# Patient Record
Sex: Female | Born: 1980 | Race: White | Hispanic: No | Smoking: Current every day smoker
Health system: Southern US, Community
[De-identification: ages and names within clinical notes are randomized; demographics above are authoritative.]

## PROBLEM LIST (undated history)

## (undated) DIAGNOSIS — F419 Anxiety disorder, unspecified: Secondary | ICD-10-CM

## (undated) DIAGNOSIS — R519 Headache, unspecified: Secondary | ICD-10-CM

## (undated) DIAGNOSIS — N920 Excessive and frequent menstruation with regular cycle: Secondary | ICD-10-CM

## (undated) DIAGNOSIS — R87612 Low grade squamous intraepithelial lesion on cytologic smear of cervix (LGSIL): Secondary | ICD-10-CM

## (undated) DIAGNOSIS — B019 Varicella without complication: Secondary | ICD-10-CM

## (undated) DIAGNOSIS — N946 Dysmenorrhea, unspecified: Secondary | ICD-10-CM

## (undated) DIAGNOSIS — D649 Anemia, unspecified: Secondary | ICD-10-CM

## (undated) DIAGNOSIS — S92309A Fracture of unspecified metatarsal bone(s), unspecified foot, initial encounter for closed fracture: Secondary | ICD-10-CM

## (undated) DIAGNOSIS — M5030 Other cervical disc degeneration, unspecified cervical region: Secondary | ICD-10-CM

## (undated) DIAGNOSIS — I10 Essential (primary) hypertension: Secondary | ICD-10-CM

## (undated) DIAGNOSIS — E785 Hyperlipidemia, unspecified: Secondary | ICD-10-CM

## (undated) HISTORY — PX: WISDOM TOOTH EXTRACTION: SHX21

---

## 2013-02-07 ENCOUNTER — Ambulatory Visit: Payer: Self-pay | Admitting: Orthopedic Surgery

## 2016-04-12 HISTORY — PX: OTHER SURGICAL HISTORY: SHX169

## 2016-05-26 DIAGNOSIS — J019 Acute sinusitis, unspecified: Secondary | ICD-10-CM | POA: Diagnosis not present

## 2016-07-05 DIAGNOSIS — M542 Cervicalgia: Secondary | ICD-10-CM | POA: Diagnosis not present

## 2016-07-05 DIAGNOSIS — E782 Mixed hyperlipidemia: Secondary | ICD-10-CM | POA: Diagnosis not present

## 2016-07-05 DIAGNOSIS — I10 Essential (primary) hypertension: Secondary | ICD-10-CM | POA: Diagnosis not present

## 2016-09-30 DIAGNOSIS — J301 Allergic rhinitis due to pollen: Secondary | ICD-10-CM | POA: Diagnosis not present

## 2016-09-30 DIAGNOSIS — J342 Deviated nasal septum: Secondary | ICD-10-CM | POA: Diagnosis not present

## 2016-10-06 DIAGNOSIS — J301 Allergic rhinitis due to pollen: Secondary | ICD-10-CM | POA: Diagnosis not present

## 2016-10-21 DIAGNOSIS — J342 Deviated nasal septum: Secondary | ICD-10-CM | POA: Diagnosis not present

## 2016-10-21 DIAGNOSIS — J3489 Other specified disorders of nose and nasal sinuses: Secondary | ICD-10-CM | POA: Diagnosis not present

## 2017-03-11 DIAGNOSIS — I10 Essential (primary) hypertension: Secondary | ICD-10-CM | POA: Diagnosis not present

## 2017-03-11 DIAGNOSIS — Z0000 Encounter for general adult medical examination without abnormal findings: Secondary | ICD-10-CM | POA: Diagnosis not present

## 2017-03-15 DIAGNOSIS — J342 Deviated nasal septum: Secondary | ICD-10-CM | POA: Diagnosis not present

## 2017-03-15 DIAGNOSIS — J3489 Other specified disorders of nose and nasal sinuses: Secondary | ICD-10-CM | POA: Diagnosis not present

## 2017-03-15 DIAGNOSIS — J343 Hypertrophy of nasal turbinates: Secondary | ICD-10-CM | POA: Diagnosis not present

## 2017-04-26 DIAGNOSIS — E782 Mixed hyperlipidemia: Secondary | ICD-10-CM | POA: Diagnosis not present

## 2017-04-26 DIAGNOSIS — Z79899 Other long term (current) drug therapy: Secondary | ICD-10-CM | POA: Diagnosis not present

## 2017-04-26 DIAGNOSIS — I10 Essential (primary) hypertension: Secondary | ICD-10-CM | POA: Diagnosis not present

## 2017-07-07 DIAGNOSIS — J3489 Other specified disorders of nose and nasal sinuses: Secondary | ICD-10-CM | POA: Diagnosis not present

## 2017-07-07 DIAGNOSIS — M950 Acquired deformity of nose: Secondary | ICD-10-CM | POA: Diagnosis not present

## 2017-07-07 DIAGNOSIS — J342 Deviated nasal septum: Secondary | ICD-10-CM | POA: Diagnosis not present

## 2017-07-07 DIAGNOSIS — J343 Hypertrophy of nasal turbinates: Secondary | ICD-10-CM | POA: Diagnosis not present

## 2017-09-09 DIAGNOSIS — E782 Mixed hyperlipidemia: Secondary | ICD-10-CM | POA: Diagnosis not present

## 2018-02-15 DIAGNOSIS — G43709 Chronic migraine without aura, not intractable, without status migrainosus: Secondary | ICD-10-CM | POA: Diagnosis not present

## 2021-01-08 ENCOUNTER — Other Ambulatory Visit: Payer: Self-pay | Admitting: Internal Medicine

## 2021-01-08 DIAGNOSIS — Z1231 Encounter for screening mammogram for malignant neoplasm of breast: Secondary | ICD-10-CM

## 2021-01-23 ENCOUNTER — Other Ambulatory Visit: Payer: Self-pay

## 2021-01-23 ENCOUNTER — Ambulatory Visit
Admission: RE | Admit: 2021-01-23 | Discharge: 2021-01-23 | Disposition: A | Discharge status: Home / Self Care | Payer: 59 | Source: Ambulatory Visit | Attending: Internal Medicine | Admitting: Internal Medicine

## 2021-01-23 DIAGNOSIS — Z1231 Encounter for screening mammogram for malignant neoplasm of breast: Secondary | ICD-10-CM | POA: Insufficient documentation

## 2022-07-28 ENCOUNTER — Other Ambulatory Visit: Payer: Self-pay | Admitting: Obstetrics and Gynecology

## 2022-09-21 NOTE — H&P (Signed)
Kristi English is a 42 y.o. female here for tah + bilateral salpingectomy  .   Pt is here for follow up for severe dysmenorrhea and menorrhagia. Pain is so bad she has to take off days from work. Her bleeding is 7 days ++ clots . + tobacco use  . [name] She declines conservative tx . She also has LGSIL on pap and her exam is difficult with her cervix high in vault .   U/s today :  Uterus anteverted   Endometrium=6.65mm   Left ovary appears wnl Rt dominant follicle=1.3cm No free fluid seen   Fibroid seen: fundal posterior=1.8cm       Past Medical History:  has a past medical history of Allergic state, Anxiety, Chickenpox, DDD (degenerative disc disease), cervical, Hyperlipidemia, Hypertension, and Migraine.  Past Surgical History:  has a past surgical history that includes Wisdom teeth. Family History: family history includes Diabetes in her father and maternal grandmother; Hyperlipidemia (Elevated cholesterol) in her mother; Migraines in her mother. Social History:  reports that she has been smoking cigarettes. She has been smoking an average of 0.5 packs per day. She has never used smokeless tobacco. She reports current alcohol use. She reports that she does not use drugs. OB/GYN History:  OB History       Gravida  0   Para  0   Term  0   Preterm  0   AB  0   Living  0        SAB  0   IAB  0   Ectopic  0   Molar      Multiple  0   Live Births                Allergies: is allergic to xyzal [levocetirizine]. Medications:  Current Medications    Current Outpatient Medications:    ALPRAZolam (XANAX) 0.5 MG tablet, Take 1 tablet (0.5 mg total) by mouth 2 (two) times daily as needed for Sleep, Disp: 60 tablet, Rfl: 5   bisoproloL-hydroCHLOROthiazide (ZIAC) 10-6.25 mg tablet, TAKE 1 TABLET DAILY, Disp: 90 tablet, Rfl: 3   Compound Medication, Lipo den 1mL given IM mthly in office, Disp: , Rfl:    cyanocobalamin (VITAMIN B12) 1,000 mcg/mL injection, Inject into the  muscle monthly IM mthly in office, Disp: , Rfl:    diphenhydrAMINE (BENADRYL) 25 mg capsule, Take 25 mg by mouth once daily. Take at bedtime., Disp: , Rfl:    FLUoxetine (PROZAC) 20 MG capsule, TAKE 2 CAPSULES ONCE DAILY, Disp: 180 capsule, Rfl: 3   phentermine (ADIPEX-P) 37.5 mg tablet, Take 1 tablet (37.5 mg total) by mouth every morning before breakfast for 30 days, Disp: 30 tablet, Rfl: 0   rosuvastatin (CRESTOR) 10 MG tablet, TAKE 1 TABLET DAILY (NO MORE WITHOUT APPOINTMENT), Disp: 90 tablet, Rfl: 3   albuterol 90 mcg/actuation inhaler, Inhale 2 inhalations into the lungs every 6 (six) hours as needed for Wheezing (Patient not taking: Reported on 09/04/2021), Disp: 1 each, Rfl: 0   oxyBUTYnin (DITROPAN) 5 mg tablet, Take 1 tablet (5 mg total) by mouth 2 (two) times daily (Patient not taking: Reported on 03/10/2022), Disp: 60 tablet, Rfl: 1   topiramate (TOPAMAX) 50 MG tablet, Take 1 tablet (50 mg total) by mouth at bedtime, Disp: 90 tablet, Rfl: 3     Review of Systems: General:                      No fatigue or  weight loss Eyes:                           No vision changes Ears:                            No hearing difficulty Respiratory:                No cough or shortness of breath Pulmonary:                  No asthma or shortness of breath Cardiovascular:           No chest pain, palpitations, dyspnea on exertion Gastrointestinal:          No abdominal bloating, chronic diarrhea, constipations, masses, pain or hematochezia Genitourinary:             No hematuria, dysuria, abnormal vaginal discharge, pelvic pain, Menometrorrhagia Lymphatic:                   No swollen lymph nodes Musculoskeletal:No muscle weakness Neurologic:                  No extremity weakness, syncope, seizure disorder Psychiatric:                  No history of depression, delusions or suicidal/homicidal ideation      Exam:       Vitals:    06/29/22 1546  BP: 114/84  Pulse: 94      Body mass index  is 32.28 kg/m.   WDWN white/  female in NAD   Lungs: CTA  CV : RRR without murmur   Breast: exam done in sitting and lying position : No dimpling or retraction, no dominant mass, no spontaneous discharge, no axillary adenopathy Neck:  no thyromegaly Abdomen: soft , no mass, normal active bowel sounds,  non-tender, no rebound tenderness Pelvic: tanner stage 5 ,  External genitalia: vulva /labia no lesions Urethra: no prolapse Vagina: normal physiologic d/c Cervix:  +cervix high , large speculum no lesions, no cervical motion tenderness   Uterus: normal size shape and contour, non-tender Adnexa: no mass,  non-tender       Impression:    The primary encounter diagnosis was Severe dysmenorrhea. Diagnoses of Menorrhagia with regular cycle and Low grade squamous intraepith lesion on cytologic smear cervix (lgsil) were also pertinent to this visit.       Plan:    Options for colposcopy and placement of Mirena IUD in OR was discussed . She wishes to move for definitive surgery given the severity of her dysmenorrhea. Given her exam  and her current history of dysplasia I recommend a TAH and bilateral salpingectomy. She elects for this   Information of the surgery is supplied   Risks of the procedure discussed . See [facility] for preop appt         No follow-ups on file.   Kristi Prader, MD

## 2022-09-30 ENCOUNTER — Other Ambulatory Visit: Payer: Self-pay

## 2022-09-30 ENCOUNTER — Encounter
Admission: RE | Admit: 2022-09-30 | Discharge: 2022-09-30 | Disposition: A | Discharge status: Home / Self Care | Payer: 59 | Source: Ambulatory Visit | Attending: Obstetrics and Gynecology | Admitting: Obstetrics and Gynecology

## 2022-09-30 DIAGNOSIS — Z01812 Encounter for preprocedural laboratory examination: Secondary | ICD-10-CM

## 2022-09-30 DIAGNOSIS — I10 Essential (primary) hypertension: Secondary | ICD-10-CM

## 2022-09-30 HISTORY — DX: Other cervical disc degeneration, unspecified cervical region: M50.30

## 2022-09-30 HISTORY — DX: Hyperlipidemia, unspecified: E78.5

## 2022-09-30 HISTORY — DX: Low grade squamous intraepithelial lesion on cytologic smear of cervix (LGSIL): R87.612

## 2022-09-30 HISTORY — DX: Excessive and frequent menstruation with regular cycle: N92.0

## 2022-09-30 HISTORY — DX: Fracture of unspecified metatarsal bone(s), unspecified foot, initial encounter for closed fracture: S92.309A

## 2022-09-30 HISTORY — DX: Varicella without complication: B01.9

## 2022-09-30 HISTORY — DX: Anemia, unspecified: D64.9

## 2022-09-30 HISTORY — DX: Essential (primary) hypertension: I10

## 2022-09-30 HISTORY — DX: Headache, unspecified: R51.9

## 2022-09-30 HISTORY — DX: Dysmenorrhea, unspecified: N94.6

## 2022-09-30 HISTORY — DX: Anxiety disorder, unspecified: F41.9

## 2022-09-30 NOTE — Patient Instructions (Addendum)
Your procedure is scheduled on:10/06/22 - Wednesday Report to the Registration Desk on the 1st floor of the [facility]. To find out your arrival time, please call [phone] between 1PM - 3PM on: 10/05/22 - Tuesday If your arrival time is 6:00 am, do not arrive before that time as the [facility] entrance doors do not open until 6:00 am.  REMEMBER: Instructions that are not followed completely may result in serious medical risk, up to and including death; or upon the discretion of your surgeon and anesthesiologist your surgery may need to be rescheduled.  Do not eat food after midnight the night before surgery.  No gum chewing or hard candies.  You may however, drink CLEAR liquids up to 2 hours before you are scheduled to arrive for your surgery. Do not drink anything within 2 hours of your scheduled arrival time.  Clear liquids include: - water  - apple juice without pulp - gatorade (not RED colors) - black coffee or tea (Do NOT add milk or creamers to the coffee or tea) Do NOT drink anything that is not on this list.  In addition, your doctor has ordered for you to drink the provided:  Ensure Pre-Surgery Clear Carbohydrate Drink  Drinking this carbohydrate drink up to two hours before surgery helps to reduce insulin resistance and improve patient outcomes. Please complete drinking 2 hours before scheduled arrival time.  One week prior to surgery: Stop Anti-inflammatories (NSAIDS) such as Advil, Aleve, Ibuprofen, Motrin, Naproxen, Naprosyn and Aspirin based products such as Excedrin, Goody's Powder, BC Powder.  Stop ANY OVER THE COUNTER supplements until after surgery.  You may take Tylenol if needed for pain up until the day of surgery.   TAKE ONLY THESE MEDICATIONS THE MORNING OF SURGERY WITH A SIP OF WATER: NONE   No Alcohol for 24 hours before or after surgery.  No Smoking including e-cigarettes for 24 hours before surgery.  No chewable tobacco products for at least  6 hours before surgery.  No nicotine patches on the day of surgery.  Do not use any "recreational" drugs for at least a week (preferably 2 weeks) before your surgery.  Please be advised that the combination of cocaine and anesthesia may have negative outcomes, up to and including death. If you test positive for cocaine, your surgery will be cancelled.  On the morning of surgery brush your teeth with toothpaste and water, you may rinse your mouth with mouthwash if you wish. Do not swallow any toothpaste or mouthwash.  Use CHG Soap or wipes as directed on instruction sheet.  Do not wear jewelry, make-up, hairpins, clips or nail polish.  Do not wear lotions, powders, or perfumes.   Do not shave body hair from the neck down 48 hours before surgery.  Contact lenses, hearing aids and dentures may not be worn into surgery.  Do not bring valuables to the hospital. [facility] is not responsible for any missing/lost belongings or valuables.    Notify your doctor if there is any change in your medical condition (cold, fever, infection).  Wear comfortable clothing (specific to your surgery type) to the hospital.  After surgery, you can help prevent lung complications by doing breathing exercises.  Take deep breaths and cough every 1-2 hours. Your doctor may order a device called an Incentive Spirometer to help you take deep breaths. When coughing or sneezing, hold a pillow firmly against your incision with both hands. This is called "splinting." Doing this helps protect your incision. It  also decreases belly discomfort.  If you are being admitted to the hospital overnight, leave your suitcase in the car. After surgery it may be brought to your room.  In case of increased patient census, it may be necessary for you, the patient, to continue your postoperative care in the Same Day Surgery department.  If you are being discharged the day of surgery, you will not be allowed to drive home. You will  need a responsible individual to drive you home and stay with you for 24 hours after surgery.   If you are taking public transportation, you will need to have a responsible individual with you.  Please call the Pre-admissions Testing Dept. at [phone] if you have any questions about these instructions.  Surgery Visitation Policy:  Patients having surgery or a procedure may have two visitors.  Children under the age of 9 must have an adult with them who is not the patient.  Inpatient Visitation:    Visiting hours are 7 a.m. to 8 p.m. Up to four visitors are allowed at one time in a patient room. The visitors may rotate out with other people during the day.  One visitor age 55 or older may stay with the patient overnight and must be in the room by 8 p.m.    Preparing for Surgery with CHLORHEXIDINE GLUCONATE (CHG) Soap  Chlorhexidine Gluconate (CHG) Soap  o An antiseptic cleaner that kills germs and bonds with the skin to continue killing germs even after washing  o Used for showering the night before surgery and morning of surgery  Before surgery, you can play an important role by reducing the number of germs on your skin.  CHG (Chlorhexidine gluconate) soap is an antiseptic cleanser which kills germs and bonds with the skin to continue killing germs even after washing.  Please do not use if you have an allergy to CHG or antibacterial soaps. If your skin becomes reddened/irritated stop using the CHG.  1. Shower the NIGHT BEFORE SURGERY and the MORNING OF SURGERY with CHG soap.  2. If you choose to wash your hair, wash your hair first as usual with your normal shampoo.  3. After shampooing, rinse your hair and body thoroughly to remove the shampoo.  4. Use CHG as you would any other liquid soap. You can apply CHG directly to the skin and wash gently with a scrungie or a clean washcloth.  5. Apply the CHG soap to your body only from the neck down. Do not use on open wounds  or open sores. Avoid contact with your eyes, ears, mouth, and genitals (private parts). Wash face and genitals (private parts) with your normal soap.  6. Wash thoroughly, paying special attention to the area where your surgery will be performed.  7. Thoroughly rinse your body with warm water.  8. Do not shower/wash with your normal soap after using and rinsing off the CHG soap.  9. Pat yourself dry with a clean towel.  10. Wear clean pajamas to bed the night before surgery.  12. Place clean sheets on your bed the night of your first shower and do not sleep with pets.  13. Shower again with the CHG soap on the day of surgery prior to arriving at the hospital.  14. Do not apply any deodorants/lotions/powders.  15. Please wear clean clothes to the hospital.

## 2022-10-01 ENCOUNTER — Encounter
Admission: RE | Admit: 2022-10-01 | Discharge: 2022-10-01 | Disposition: A | Discharge status: Home / Self Care | Payer: 59 | Source: Ambulatory Visit | Attending: Obstetrics and Gynecology | Admitting: Obstetrics and Gynecology

## 2022-10-01 DIAGNOSIS — Z01810 Encounter for preprocedural cardiovascular examination: Secondary | ICD-10-CM | POA: Diagnosis not present

## 2022-10-01 DIAGNOSIS — Z01818 Encounter for other preprocedural examination: Secondary | ICD-10-CM | POA: Insufficient documentation

## 2022-10-01 DIAGNOSIS — I10 Essential (primary) hypertension: Secondary | ICD-10-CM | POA: Diagnosis not present

## 2022-10-01 LAB — BASIC METABOLIC PANEL
Anion gap: 11 (ref 5–15)
BUN: 16 mg/dL (ref 6–20)
CO2: 21 mmol/L — ABNORMAL LOW (ref 22–32)
Calcium: 9.1 mg/dL (ref 8.9–10.3)
Chloride: 104 mmol/L (ref 98–111)
Creatinine, Ser: 0.74 mg/dL (ref 0.44–1.00)
GFR, Estimated: >60 mL/min (ref >60)
Glucose, Bld: 98 mg/dL (ref 70–99)
Potassium: 3.9 mmol/L (ref 3.5–5.1)
Sodium: 136 mmol/L (ref 135–145)

## 2022-10-01 LAB — CBC
HCT: 38.7 % (ref 36.0–46.0)
Hemoglobin: 13 g/dL (ref 12.0–15.0)
MCH: 30.5 pg (ref 26.0–34.0)
MCHC: 33.6 g/dL (ref 30.0–36.0)
MCV: 90.8 fL (ref 80.0–100.0)
Platelets: 259 10*3/uL (ref 150–400)
RBC: 4.26 MIL/uL (ref 3.87–5.11)
RDW: 12.7 % (ref 11.5–15.5)
WBC: 7.5 10*3/uL (ref 4.0–10.5)
nRBC: 0 % (ref 0.0–0.2)

## 2022-10-01 LAB — TYPE AND SCREEN
ABO/RH(D): B POS
Antibody Screen: NEGATIVE

## 2022-10-04 ENCOUNTER — Other Ambulatory Visit: Payer: Self-pay | Admitting: Obstetrics and Gynecology

## 2022-10-06 ENCOUNTER — Other Ambulatory Visit: Payer: Self-pay

## 2022-10-06 ENCOUNTER — Inpatient Hospital Stay: Payer: 59 | Admitting: Urgent Care

## 2022-10-06 ENCOUNTER — Encounter: Payer: Self-pay | Admitting: Obstetrics and Gynecology

## 2022-10-06 ENCOUNTER — Inpatient Hospital Stay
Admission: RE | Admit: 2022-10-06 | Discharge: 2022-10-07 | DRG: 743 | Disposition: A | Discharge status: Home / Self Care | Payer: 59 | Source: Ambulatory Visit | Attending: Obstetrics and Gynecology | Admitting: Obstetrics and Gynecology

## 2022-10-06 ENCOUNTER — Encounter
Admission: RE | Disposition: A | Discharge status: Home / Self Care | Payer: Self-pay | Source: Ambulatory Visit | Attending: Obstetrics and Gynecology

## 2022-10-06 DIAGNOSIS — F17210 Nicotine dependence, cigarettes, uncomplicated: Secondary | ICD-10-CM | POA: Diagnosis present

## 2022-10-06 DIAGNOSIS — I10 Essential (primary) hypertension: Secondary | ICD-10-CM | POA: Diagnosis present

## 2022-10-06 DIAGNOSIS — Z01818 Encounter for other preprocedural examination: Principal | ICD-10-CM

## 2022-10-06 DIAGNOSIS — N920 Excessive and frequent menstruation with regular cycle: Secondary | ICD-10-CM | POA: Diagnosis present

## 2022-10-06 DIAGNOSIS — Z01812 Encounter for preprocedural laboratory examination: Secondary | ICD-10-CM

## 2022-10-06 DIAGNOSIS — N879 Dysplasia of cervix uteri, unspecified: Secondary | ICD-10-CM | POA: Diagnosis present

## 2022-10-06 DIAGNOSIS — Z9889 Other specified postprocedural states: Secondary | ICD-10-CM

## 2022-10-06 DIAGNOSIS — N946 Dysmenorrhea, unspecified: Secondary | ICD-10-CM | POA: Diagnosis present

## 2022-10-06 HISTORY — PX: CYSTOSCOPY: SHX5120

## 2022-10-06 HISTORY — PX: HYSTERECTOMY ABDOMINAL WITH SALPINGECTOMY: SHX6725

## 2022-10-06 LAB — ABO/RH: ABO/RH(D): B POS

## 2022-10-06 LAB — POCT PREGNANCY, URINE: Preg Test, Ur: NEGATIVE

## 2022-10-06 SURGERY — HYSTERECTOMY, TOTAL, ABDOMINAL, WITH SALPINGECTOMY
Anesthesia: General | Site: Bladder

## 2022-10-06 MED ORDER — FAMOTIDINE 20 MG PO TABS
20.0000 mg | ORAL_TABLET | Freq: Once | ORAL | Status: AC
  Administered 2022-10-06: 20 mg via ORAL

## 2022-10-06 MED ORDER — ONDANSETRON HCL 4 MG/2ML IJ SOLN
4.0000 mg | Freq: Four times a day (QID) | INTRAMUSCULAR | Status: DC | PRN
  Administered 2022-10-06: 4 mg via INTRAVENOUS

## 2022-10-06 MED ORDER — SUGAMMADEX SODIUM 200 MG/2ML IV SOLN
INTRAVENOUS | Status: DC | PRN
  Administered 2022-10-06: 200 mg via INTRAVENOUS

## 2022-10-06 MED ORDER — DEXMEDETOMIDINE HCL IN NACL 80 MCG/20ML IV SOLN
INTRAVENOUS | Status: DC | PRN
  Administered 2022-10-06: 8 ug via INTRAVENOUS
  Administered 2022-10-06: 12 ug via INTRAVENOUS
  Administered 2022-10-06: 4 ug via INTRAVENOUS

## 2022-10-06 MED ORDER — ONDANSETRON HCL 4 MG/2ML IJ SOLN: 4.0000 mg | Freq: Four times a day (QID) | INTRAMUSCULAR | Status: DC | PRN

## 2022-10-06 MED ORDER — ORAL CARE MOUTH RINSE: 15.0000 mL | Freq: Once | OROMUCOSAL | Status: AC

## 2022-10-06 MED ORDER — FLUORESCEIN SODIUM 10 % IV SOLN
INTRAVENOUS | Status: AC
  Filled 2022-10-06: qty 5

## 2022-10-06 MED ORDER — DIPHENHYDRAMINE HCL 12.5 MG/5ML PO ELIX: 12.5000 mg | ORAL_SOLUTION | Freq: Four times a day (QID) | ORAL | Status: DC | PRN

## 2022-10-06 MED ORDER — SODIUM CHLORIDE (PF) 0.9 % IJ SOLN
INTRAMUSCULAR | Status: DC | PRN
  Administered 2022-10-06: 55 mL via INTRAMUSCULAR

## 2022-10-06 MED ORDER — FUROSEMIDE 10 MG/ML IJ SOLN
INTRAMUSCULAR | Status: DC | PRN
  Administered 2022-10-06: 5 mg via INTRAMUSCULAR

## 2022-10-06 MED ORDER — DIPHENHYDRAMINE HCL 50 MG/ML IJ SOLN: 12.5000 mg | Freq: Four times a day (QID) | INTRAMUSCULAR | Status: DC | PRN

## 2022-10-06 MED ORDER — SODIUM CHLORIDE 0.9% FLUSH: 9.0000 mL | INTRAVENOUS | Status: DC | PRN

## 2022-10-06 MED ORDER — FENTANYL CITRATE (PF) 100 MCG/2ML IJ SOLN
INTRAMUSCULAR | Status: AC
  Filled 2022-10-06: qty 2

## 2022-10-06 MED ORDER — LACTATED RINGERS IV SOLN: INTRAVENOUS | Status: DC

## 2022-10-06 MED ORDER — FENTANYL CITRATE (PF) 100 MCG/2ML IJ SOLN
25.0000 ug | INTRAMUSCULAR | Status: DC | PRN
  Administered 2022-10-06 (×4): 50 ug via INTRAVENOUS

## 2022-10-06 MED ORDER — SODIUM CHLORIDE 0.9 % IV SOLN: INTRAVENOUS | Status: DC | PRN

## 2022-10-06 MED ORDER — CHLORHEXIDINE GLUCONATE 0.12 % MT SOLN
15.0000 mL | Freq: Once | OROMUCOSAL | Status: AC
  Administered 2022-10-06: 15 mL via OROMUCOSAL

## 2022-10-06 MED ORDER — ACETAMINOPHEN 500 MG PO TABS
1000.0000 mg | ORAL_TABLET | ORAL | Status: AC
  Administered 2022-10-06: 1000 mg via ORAL

## 2022-10-06 MED ORDER — SIMETHICONE 80 MG PO CHEW
80.0000 mg | CHEWABLE_TABLET | Freq: Four times a day (QID) | ORAL | Status: DC | PRN
  Administered 2022-10-06: 80 mg via ORAL

## 2022-10-06 MED ORDER — CEFAZOLIN SODIUM-DEXTROSE 2-4 GM/100ML-% IV SOLN
INTRAVENOUS | Status: AC
  Filled 2022-10-06: qty 100

## 2022-10-06 MED ORDER — ACETAMINOPHEN 500 MG PO TABS
ORAL_TABLET | ORAL | Status: AC
  Filled 2022-10-06: qty 2

## 2022-10-06 MED ORDER — FAMOTIDINE 20 MG PO TABS
ORAL_TABLET | ORAL | Status: AC
  Filled 2022-10-06: qty 1

## 2022-10-06 MED ORDER — KETOROLAC TROMETHAMINE 15 MG/ML IJ SOLN
INTRAMUSCULAR | Status: DC | PRN
  Administered 2022-10-06: 30 mg via INTRAVENOUS

## 2022-10-06 MED ORDER — MIDAZOLAM HCL 2 MG/2ML IJ SOLN
INTRAMUSCULAR | Status: AC
  Filled 2022-10-06: qty 2

## 2022-10-06 MED ORDER — FLUORESCEIN SODIUM 10 % IV SOLN
INTRAVENOUS | Status: DC | PRN
  Administered 2022-10-06: 50 mg via INTRAVENOUS

## 2022-10-06 MED ORDER — CHLORHEXIDINE GLUCONATE 0.12 % MT SOLN
OROMUCOSAL | Status: AC
  Filled 2022-10-06: qty 15

## 2022-10-06 MED ORDER — GABAPENTIN 300 MG PO CAPS
ORAL_CAPSULE | ORAL | Status: AC
  Filled 2022-10-06: qty 1

## 2022-10-06 MED ORDER — FENTANYL CITRATE (PF) 100 MCG/2ML IJ SOLN
INTRAMUSCULAR | Status: DC | PRN
  Administered 2022-10-06: 25 ug via INTRAVENOUS
  Administered 2022-10-06 (×2): 50 ug via INTRAVENOUS
  Administered 2022-10-06: 25 ug via INTRAVENOUS
  Administered 2022-10-06: 50 ug via INTRAVENOUS

## 2022-10-06 MED ORDER — PROPOFOL 10 MG/ML IV BOLUS
INTRAVENOUS | Status: DC | PRN
  Administered 2022-10-06: 100 mg via INTRAVENOUS

## 2022-10-06 MED ORDER — ACETAMINOPHEN 500 MG PO TABS
1000.0000 mg | ORAL_TABLET | Freq: Four times a day (QID) | ORAL | Status: DC
  Administered 2022-10-06 – 2022-10-07 (×5): 1000 mg via ORAL
  Filled 2022-10-06 (×5): qty 2

## 2022-10-06 MED ORDER — 0.9 % SODIUM CHLORIDE (POUR BTL) OPTIME
TOPICAL | Status: DC | PRN
  Administered 2022-10-06: 500 mL
  Administered 2022-10-06: 1000 mL

## 2022-10-06 MED ORDER — CEFAZOLIN SODIUM-DEXTROSE 2-4 GM/100ML-% IV SOLN
2.0000 g | Freq: Once | INTRAVENOUS | Status: AC
  Administered 2022-10-06: 2 g via INTRAVENOUS

## 2022-10-06 MED ORDER — PHENAZOPYRIDINE HCL 200 MG PO TABS
200.0000 mg | ORAL_TABLET | Freq: Three times a day (TID) | ORAL | Status: DC
  Filled 2022-10-06: qty 1

## 2022-10-06 MED ORDER — LIDOCAINE HCL (CARDIAC) PF 100 MG/5ML IV SOSY
PREFILLED_SYRINGE | INTRAVENOUS | Status: DC | PRN
  Administered 2022-10-06: 100 mg via INTRAVENOUS

## 2022-10-06 MED ORDER — GABAPENTIN 300 MG PO CAPS
300.0000 mg | ORAL_CAPSULE | ORAL | Status: AC
  Administered 2022-10-06: 300 mg via ORAL

## 2022-10-06 MED ORDER — OXYCODONE HCL 5 MG/5ML PO SOLN: 5.0000 mg | Freq: Once | ORAL | Status: DC | PRN

## 2022-10-06 MED ORDER — METRONIDAZOLE 500 MG/100ML IV SOLN
500.0000 mg | Freq: Once | INTRAVENOUS | Status: AC
  Administered 2022-10-06: 500 mg via INTRAVENOUS
  Filled 2022-10-06: qty 100

## 2022-10-06 MED ORDER — KETOROLAC TROMETHAMINE 30 MG/ML IJ SOLN
30.0000 mg | Freq: Four times a day (QID) | INTRAMUSCULAR | Status: AC
  Administered 2022-10-06 – 2022-10-07 (×4): 30 mg via INTRAVENOUS
  Filled 2022-10-06 (×4): qty 1

## 2022-10-06 MED ORDER — SODIUM CHLORIDE FLUSH 0.9 % IV SOLN
INTRAVENOUS | Status: AC
  Filled 2022-10-06: qty 20

## 2022-10-06 MED ORDER — IBUPROFEN 600 MG PO TABS
600.0000 mg | ORAL_TABLET | Freq: Four times a day (QID) | ORAL | Status: DC
  Administered 2022-10-07: 600 mg via ORAL
  Filled 2022-10-06: qty 1

## 2022-10-06 MED ORDER — MIDAZOLAM HCL 2 MG/2ML IJ SOLN
INTRAMUSCULAR | Status: DC | PRN
  Administered 2022-10-06: 2 mg via INTRAVENOUS

## 2022-10-06 MED ORDER — ONDANSETRON HCL 4 MG/2ML IJ SOLN
INTRAMUSCULAR | Status: DC | PRN
  Administered 2022-10-06: 4 mg via INTRAVENOUS

## 2022-10-06 MED ORDER — PROPOFOL 1000 MG/100ML IV EMUL
INTRAVENOUS | Status: AC
  Filled 2022-10-06: qty 100

## 2022-10-06 MED ORDER — ONDANSETRON HCL 4 MG/2ML IJ SOLN
INTRAMUSCULAR | Status: AC
  Filled 2022-10-06: qty 2

## 2022-10-06 MED ORDER — ROCURONIUM BROMIDE 100 MG/10ML IV SOLN
INTRAVENOUS | Status: DC | PRN
  Administered 2022-10-06: 50 mg via INTRAVENOUS
  Administered 2022-10-06 (×2): 20 mg via INTRAVENOUS
  Administered 2022-10-06: 10 mg via INTRAVENOUS

## 2022-10-06 MED ORDER — ONDANSETRON HCL 4 MG PO TABS: 4.0000 mg | ORAL_TABLET | Freq: Four times a day (QID) | ORAL | Status: DC | PRN

## 2022-10-06 MED ORDER — POVIDONE-IODINE 10 % EX SWAB
2.0000 | Freq: Once | CUTANEOUS | Status: AC
  Administered 2022-10-06: 2 via TOPICAL

## 2022-10-06 MED ORDER — PHENAZOPYRIDINE HCL 200 MG PO TABS
200.0000 mg | ORAL_TABLET | Freq: Three times a day (TID) | ORAL | Status: DC
  Administered 2022-10-06 – 2022-10-07 (×3): 200 mg via ORAL
  Filled 2022-10-06 (×5): qty 1

## 2022-10-06 MED ORDER — OXYCODONE HCL 5 MG PO TABS: 5.0000 mg | ORAL_TABLET | Freq: Once | ORAL | Status: DC | PRN

## 2022-10-06 MED ORDER — SODIUM CHLORIDE 0.9 % IR SOLN
Status: DC | PRN
  Administered 2022-10-06: 1000 mL via INTRAVESICAL

## 2022-10-06 MED ORDER — NALOXONE HCL 0.4 MG/ML IJ SOLN: 0.4000 mg | INTRAMUSCULAR | Status: DC | PRN

## 2022-10-06 MED ORDER — GABAPENTIN 300 MG PO CAPS
300.0000 mg | ORAL_CAPSULE | Freq: Every day | ORAL | Status: DC
  Administered 2022-10-06: 300 mg via ORAL
  Filled 2022-10-06: qty 1

## 2022-10-06 MED ORDER — DEXAMETHASONE SODIUM PHOSPHATE 10 MG/ML IJ SOLN
INTRAMUSCULAR | Status: DC | PRN
  Administered 2022-10-06: 10 mg via INTRAVENOUS

## 2022-10-06 MED ORDER — BUPIVACAINE LIPOSOME 1.3 % IJ SUSP
INTRAMUSCULAR | Status: AC
  Filled 2022-10-06: qty 20

## 2022-10-06 MED ORDER — MORPHINE SULFATE 1 MG/ML IV SOLN PCA
INTRAVENOUS | Status: DC
  Administered 2022-10-06: 19.8 mg via INTRAVENOUS
  Administered 2022-10-06: 9.68 mL via INTRAVENOUS
  Administered 2022-10-07: 8.64 mL via INTRAVENOUS
  Administered 2022-10-07: 12.3 mL via INTRAVENOUS
  Administered 2022-10-07: 12.34 mL via INTRAVENOUS
  Administered 2022-10-07: 7.4 mL via INTRAVENOUS
  Filled 2022-10-06 (×3): qty 30

## 2022-10-06 MED ORDER — BUPIVACAINE HCL (PF) 0.5 % IJ SOLN
INTRAMUSCULAR | Status: AC
  Filled 2022-10-06: qty 60

## 2022-10-06 SURGICAL SUPPLY — 49 items
APL PRP STRL LF DISP 70% ISPRP (MISCELLANEOUS) ×2
CHLORAPREP W/TINT 26 (MISCELLANEOUS) ×2 IMPLANT
CNTNR URN SCR LID CUP LEK RST (MISCELLANEOUS) ×2 IMPLANT
CONT SPEC 4OZ STRL OR WHT (MISCELLANEOUS) ×2
DRAPE LAP W/FLUID (DRAPES) ×2 IMPLANT
DRAPE UNDER BUTTOCK W/FLU (DRAPES) ×2 IMPLANT
DRSG TELFA 3X8 NADH STRL (GAUZE/BANDAGES/DRESSINGS) ×2 IMPLANT
ELECT BLADE 6.5 EXT (BLADE) IMPLANT
ELECT CAUTERY BLADE 6.4 (BLADE) IMPLANT
ELECT REM PT RETURN 9FT ADLT (ELECTROSURGICAL) ×2
ELECTRODE REM PT RTRN 9FT ADLT (ELECTROSURGICAL) ×2 IMPLANT
GAUZE 4X4 16PLY ~~LOC~~+RFID DBL (SPONGE) ×4 IMPLANT
GAUZE SPONGE 4X4 12PLY STRL (GAUZE/BANDAGES/DRESSINGS) ×2 IMPLANT
GLOVE BIO SURGEON STRL SZ7 (GLOVE) ×2 IMPLANT
GLOVE BIOGEL PI IND STRL 6.5 (GLOVE) ×2 IMPLANT
GLOVE SURG SYN 8.0 (GLOVE) ×2 IMPLANT
GLOVE SURG SYN 8.0 PF PI (GLOVE) ×2 IMPLANT
GOWN STRL REUS W/ TWL LRG LVL3 (GOWN DISPOSABLE) ×4 IMPLANT
GOWN STRL REUS W/ TWL XL LVL3 (GOWN DISPOSABLE) ×2 IMPLANT
GOWN STRL REUS W/TWL LRG LVL3 (GOWN DISPOSABLE) ×4
GOWN STRL REUS W/TWL XL LVL3 (GOWN DISPOSABLE) ×2
KIT TURNOVER CYSTO (KITS) ×2 IMPLANT
LABEL OR SOLS (LABEL) ×2 IMPLANT
MANIFOLD NEPTUNE II (INSTRUMENTS) ×2 IMPLANT
NDL HYPO 22X1.5 SAFETY MO (MISCELLANEOUS) ×4 IMPLANT
NEEDLE HYPO 22X1.5 SAFETY MO (MISCELLANEOUS) ×4 IMPLANT
PACK BASIN MAJOR ARMC (MISCELLANEOUS) ×2 IMPLANT
RETAINER VISCERA MED (MISCELLANEOUS) IMPLANT
SET CYSTO W/LG BORE CLAMP LF (SET/KITS/TRAYS/PACK) IMPLANT
SOL PREP PVP 2OZ (MISCELLANEOUS) ×2
SOLUTION PREP PVP 2OZ (MISCELLANEOUS) ×2 IMPLANT
SPONGE T-LAP 18X18 ~~LOC~~+RFID (SPONGE) ×4 IMPLANT
STAPLER INSORB 30 2030 C-SECTI (MISCELLANEOUS) IMPLANT
STAPLER SKIN PROX 35W (STAPLE) IMPLANT
SURGILUBE 2OZ TUBE FLIPTOP (MISCELLANEOUS) ×2 IMPLANT
SUT CHROMIC 2 0 CT 1 (SUTURE) IMPLANT
SUT PDS AB 1 TP1 96 (SUTURE) IMPLANT
SUT VIC AB 0 CT1 27 (SUTURE) ×8
SUT VIC AB 0 CT1 27XCR 8 STRN (SUTURE) ×4 IMPLANT
SUT VIC AB 0 CT1 36 (SUTURE) ×4 IMPLANT
SUT VIC AB 2-0 SH 27 (SUTURE) ×8
SUT VIC AB 2-0 SH 27XBRD (SUTURE) IMPLANT
SYR 20ML LL LF (SYRINGE) ×4 IMPLANT
SYR 30ML LL (SYRINGE) ×2 IMPLANT
SYR BULB IRRIG 60ML STRL (SYRINGE) ×2 IMPLANT
TRAP FLUID SMOKE EVACUATOR (MISCELLANEOUS) ×2 IMPLANT
TRAY FOLEY MTR SLVR 16FR STAT (SET/KITS/TRAYS/PACK) ×2 IMPLANT
WATER STERILE IRR 1000ML POUR (IV SOLUTION) ×2 IMPLANT
WATER STERILE IRR 500ML POUR (IV SOLUTION) ×2 IMPLANT

## 2022-10-06 NOTE — Transfer of Care (Signed)
Immediate Anesthesia Transfer of Care Note  Patient: Kristi English  Procedure(s) Performed: HYSTERECTOMY ABDOMINAL WITH BILATERAL SALPINGECTOMY (Bilateral: Abdomen) CYSTOSCOPY (Bladder)  Patient Location: PACU  Anesthesia Type:General  Level of Consciousness: awake, alert , and oriented  Airway & Oxygen Therapy: Patient Spontanous Breathing  Post-op Assessment: Report given to RN and Post -op Vital signs reviewed and stable  Post vital signs: stable  Last Vitals:  Vitals Value Taken Time  BP 121/77 10/06/22 1031  Temp    Pulse 76 10/06/22 1033  Resp 15 10/06/22 1033  SpO2 95 % 10/06/22 1033  Vitals shown include unvalidated device data.  Last Pain:  Vitals:   10/06/22 0619  TempSrc: Temporal  PainSc: 0-No pain         Complications: There were no known notable events for this encounter.

## 2022-10-06 NOTE — Brief Op Note (Signed)
10/06/2022  10:15 AM  PATIENT:  Kristi English  42 y.o. female  PRE-OPERATIVE DIAGNOSIS:  severe dysmenorrhea, menorrhagia, cervical dysplasia  POST-OPERATIVE DIAGNOSIS:  severe dysmenorrhea, menorrhagia, cervical dysplasia  PROCEDURE:  Procedure(s): HYSTERECTOMY ABDOMINAL WITH BILATERAL SALPINGECTOMY (Bilateral) CYSTOSCOPY (N/A)  SURGEON:  Surgeon(s) and Role:    * Candus Braud, Ihor Austin, MD - Primary    * Conard Novak, MD - Assisting  PHYSICIAN ASSISTANT:   ASSISTANTS: none   ANESTHESIA:   general  EBL:  20 mL IOF 1400 cc uo 150 cc  BLOOD ADMINISTERED:none  DRAINS: Urinary Catheter (Foley)   LOCAL MEDICATIONS USED:  MARCAINE     SPECIMEN:  Source of Specimen:  cervix , uterus and bilateral fallopian tubes   DISPOSITION OF SPECIMEN:  PATHOLOGY  COUNTS:  YES  TOURNIQUET:  * No tourniquets in log *  DICTATION: .Other Dictation: Dictation Number verbal  PLAN OF CARE: Admit to inpatient   PATIENT DISPOSITION:  PACU - hemodynamically stable.   Delay start of Pharmacological VTE agent (>24hrs) due to surgical blood loss or risk of bleeding: not applicable

## 2022-10-06 NOTE — Progress Notes (Signed)
Pt is scheduled for TAH , bilateral salpingectomy  Labs reviewed . All questions answered . Proceed

## 2022-10-06 NOTE — Op Note (Signed)
NAME: Kristi English, WADDING MEDICAL RECORD NO: [id number] ACCOUNT NO: [id number] DATE OF BIRTH: 08/19/1980 FACILITY: [facility] LOCATION: [facility] PHYSICIAN: Suzy Bouchard, MD  Operative Report   DATE OF PROCEDURE: 10/06/2022  PREOPERATIVE DIAGNOSES:   1.  Menorrhagia. 2.  Dysmenorrhea. 3.  Cervical dysplasia.  POSTOPERATIVE DIAGNOSES: 1.  Menorrhagia. 2.  Dysmenorrhea. 3.  Cervical dysplasia.  PROCEDURE:   1.  Total abdominal hysterectomy. 2.  Bilateral salpingectomy.  SURGEON:  Suzy Bouchard, MD  FIRST ASSISTANT:  Odis Luster, MD  ANESTHESIA:  General endotracheal anesthesia.  INDICATIONS:  A 42 year old gravida 0, patient with a long history of menorrhagia, bleeding excessively with large clots for up to 7 days per cycle.  The patient has to take off from work given her bleeding and her pain.  The patient has elected for  definitive surgical intervention.  DESCRIPTION OF PROCEDURE:  After adequate general endotracheal anesthesia, the patient was placed in dorsal supine position with legs placed in the [place] stirrups.  The patient's abdomen and vagina prepped and draped in normal sterile fashion.  Foley  catheter was placed.  The patient did receive 2 grams of IV Ancef and 500 mg Flagyl intravenously for surgical prophylaxis.  Timeout was performed.  A Pfannenstiel incision was made 2 fingerbreadths above the symphysis pubis.  Sharp dissection was used  to identify the fascia.  Fascia was opened in the midline and opened in a transverse fashion.  Superior aspect of the fascia was grasped with Kocher clamps and recti muscle was dissected free.  Inferior aspect of the fascia was grasped with Kocher clamps  and pyramidalis muscle was dissected free.  Entry into the peritoneal cavity was accomplished with blunt dissection.  The O'Connor-O'Sullivan retractor was brought up to the operative field and the bowels were packed cephalad with laparotomy sponges.   Two large  Kelly clamps were placed on the cornua to be used for uterine elevation.  Bladder blade was used to reflect the bladder caudad.  Round ligaments on both sides were clamped, transected and suture ligated with 0 Vicryl suture.  Anterior leaf of  the broad ligament was incised along the bladder reflection to the midline from both sides.  Bladder was gently dissected off the lower uterine segment with sharp and blunt dissection.  A window in the broad ligament was made and a Heaney ballantine  clamps were placed bilaterally, incorporating the uteroovarian ligament and the proximal portion of fallopian tube.  Double clamps were used, transected and suture ligated with 0 Vicryl suture.  The fallopian tubes were then grasped and Heaney ballantine  clamps were placed and each fallopian tube was removed and pedicle secured with 0 Vicryl suture.  The uterine arteries were bilaterally skeletonized and a Heaney ballantine clamps were placed at the isthmic portion of the lower uterus.  Each pedicle  transected and suture ligated with 0 Vicryl suture.  Straight Heaney ballantine clamps were used to continue on the lateral aspect of the cervix.  Ultimately, the vaginal cuff was clamped, transected and the cervix and uterus were then dissected free  with Jorgenson scissors.  Interrupted 0 Vicryl suture was used to close the vaginal cuff.  Good hemostasis was noted.  All pedicles appeared hemostatic.  The patient's abdomen was irrigated.  All laparotomy sponges were removed and the fascia was then  closed with 0 Vicryl suture in a running nonlocking fashion, two separate sutures were used.  Good approximation of tissue.  Fascia was then injected with 0.5% Marcaine, 60  mL plus 20 mL normal saline.  40 mL were injected at the fascial edges.   Subcutaneous tissues were irrigated and bovied for hemostasis and given the depth of the subcutaneous tissues of 3 cm dead space was closed with a running 2-0 chromic suture and the skin  was reapproximated with Insorb absorbable staples.  A cystoscopy  was performed at the end of the procedure.  A 0.5 mL of fluorescein was injected intravenously and the cystoscope demonstrated a normal efflux of urine from each ureteral orifice.  A Foley catheter was replaced again and drained and additional 20 mL of  Marcaine solution was injected beneath the skin and sterile dressing applied.  The patient did receive 30 mg intravenous Toradol.  There were no complications.  ESTIMATED BLOOD LOSS:  20 mL.  INTRAOPERATIVE FLUIDS:  1400 mL  URINE OUTPUT:  150 mL.  The patient was taken to recovery room in good condition.   PUS D: 10/06/2022 11:05:22 am T: 10/06/2022 11:44:00 am  JOB: [id number]/ [id number]

## 2022-10-06 NOTE — Anesthesia Preprocedure Evaluation (Signed)
Anesthesia Evaluation  Patient identified by MRN, date of birth, ID band Patient awake    Reviewed: Allergy & Precautions, NPO status , Patient's Chart, lab work & pertinent test results  History of Anesthesia Complications Negative for: history of anesthetic complications  Airway Mallampati: III  TM Distance: >3 FB Neck ROM: full    Dental  (+) Chipped, Poor Dentition   Pulmonary Current Smoker and Patient abstained from smoking.   Pulmonary exam normal        Cardiovascular Exercise Tolerance: Good hypertension, (-) angina (-) Past MI and (-) DOE Normal cardiovascular exam     Neuro/Psych  Headaches PSYCHIATRIC DISORDERS         GI/Hepatic negative GI ROS, Neg liver ROS,,,  Endo/Other  negative endocrine ROS    Renal/GU      Musculoskeletal   Abdominal   Peds  Hematology negative hematology ROS (+)   Anesthesia Other Findings Past Medical History: No date: Anemia No date: Anxiety No date: Chicken pox No date: DDD (degenerative disc disease), cervical No date: Dysmenorrhea No date: Headache No date: Hyperlipidemia No date: Hypertension No date: LGSIL on Pap smear of cervix No date: Menorrhagia No date: Metatarsal fracture     Comment:  left foot  Past Surgical History: 2018: nasal septum repair No date: WISDOM TOOTH EXTRACTION  BMI    Body Mass Index: 31.47 kg/m      Reproductive/Obstetrics negative OB ROS                             Anesthesia Physical Anesthesia Plan  ASA: 3  Anesthesia Plan: General ETT   Post-op Pain Management:    Induction: Intravenous  PONV Risk Score and Plan: Ondansetron, Dexamethasone, Midazolam and Treatment may vary due to age or medical condition  Airway Management Planned: Oral ETT  Additional Equipment:   Intra-op Plan:   Post-operative Plan: Extubation in OR  Informed Consent: I have reviewed the patients History and  Physical, chart, labs and discussed the procedure including the risks, benefits and alternatives for the proposed anesthesia with the patient or authorized representative who has indicated his/her understanding and acceptance.     Dental Advisory Given  Plan Discussed with: Anesthesiologist, CRNA and Surgeon  Anesthesia Plan Comments: (Patient consented for risks of anesthesia including but not limited to:  - adverse reactions to medications - damage to eyes, teeth, lips or other oral mucosa - nerve damage due to positioning  - sore throat or hoarseness - Damage to heart, brain, nerves, lungs, other parts of body or loss of life  Patient voiced understanding.)       Anesthesia Quick Evaluation

## 2022-10-06 NOTE — Anesthesia Procedure Notes (Signed)
Procedure Name: Intubation Date/Time: 10/06/2022 7:47 AM  Performed by: Maryla Morrow., CRNAPre-anesthesia Checklist: Patient identified, Patient being monitored, Timeout performed, Emergency Drugs available and Suction available Patient Re-evaluated:Patient Re-evaluated prior to induction Oxygen Delivery Method: Circle system utilized Preoxygenation: Pre-oxygenation with 100% oxygen Induction Type: IV induction Ventilation: Mask ventilation without difficulty Laryngoscope Size: 3 and McGraph Grade View: Grade I Tube type: Oral Tube size: 7.0 mm Number of attempts: 1 Airway Equipment and Method: Stylet Placement Confirmation: ETT inserted through vocal cords under direct vision, positive ETCO2 and breath sounds checked- equal and bilateral Secured at: 21 cm Tube secured with: Tape Dental Injury: Teeth and Oropharynx as per pre-operative assessment

## 2022-10-07 LAB — CBC
HCT: 30.6 % — ABNORMAL LOW (ref 36.0–46.0)
Hemoglobin: 10 g/dL — ABNORMAL LOW (ref 12.0–15.0)
MCH: 30.6 pg (ref 26.0–34.0)
MCHC: 32.7 g/dL (ref 30.0–36.0)
MCV: 93.6 fL (ref 80.0–100.0)
Platelets: 210 10*3/uL (ref 150–400)
RBC: 3.27 MIL/uL — ABNORMAL LOW (ref 3.87–5.11)
RDW: 12.9 % (ref 11.5–15.5)
WBC: 15.3 10*3/uL — ABNORMAL HIGH (ref 4.0–10.5)
nRBC: 0 % (ref 0.0–0.2)

## 2022-10-07 MED ORDER — OXYCODONE-ACETAMINOPHEN 5-325 MG PO TABS: 1.0000 | ORAL_TABLET | ORAL | Status: DC | PRN

## 2022-10-07 MED ORDER — ONDANSETRON HCL 4 MG PO TABS: 4.0000 mg | ORAL_TABLET | Freq: Every day | ORAL | 1 refills | Status: AC | PRN

## 2022-10-07 MED ORDER — OXYCODONE HCL 5 MG PO TABS
5.0000 mg | ORAL_TABLET | ORAL | Status: DC | PRN
  Administered 2022-10-07: 5 mg via ORAL
  Filled 2022-10-07: qty 1

## 2022-10-07 MED ORDER — OXYCODONE-ACETAMINOPHEN 5-325 MG PO TABS: 1.0000 | ORAL_TABLET | Freq: Four times a day (QID) | ORAL | 0 refills | Status: AC | PRN

## 2022-10-07 MED ORDER — GABAPENTIN 300 MG PO CAPS: 300.0000 mg | ORAL_CAPSULE | Freq: Every day | ORAL | 2 refills | Status: AC

## 2022-10-07 MED ORDER — IBUPROFEN 800 MG PO TABS: 800.0000 mg | ORAL_TABLET | Freq: Three times a day (TID) | ORAL | 0 refills | Status: AC | PRN

## 2022-10-07 NOTE — Progress Notes (Signed)
Patient discharged. Discharge instructions given. Patient verbalizes understanding. Transported by staff. 

## 2022-10-07 NOTE — Discharge Summary (Signed)
Physician Discharge Summary  Patient ID: Kristi English MRN: [id number] DOB/AGE: 43-Jul-1982 43 y.o.  Admit date: 10/06/2022 Discharge date: 10/07/2022  Admission Diagnoses:menorrhagia , dysmenorrhea , cervical dysplasia  Discharge Diagnoses: same  Principal Problem:   Post-operative state   Discharged Condition: good  Hospital Course: pt uderwent an uncomplicated TAH , bilateral salpingectomy . Post op PCA controlled pain well and she started  on a soft diet the night of the procedure . + flatus  Consults: None  Significant Diagnostic Studies: labs:  Results for orders placed or performed during the hospital encounter of 10/06/22 (from the past 72 hour(s))  Pregnancy, urine POC     Status: None   Collection Time: 10/06/22  6:19 AM  Result Value Ref Range   Preg Test, Ur NEGATIVE NEGATIVE    Comment:        THE SENSITIVITY OF THIS METHODOLOGY IS >24 mIU/mL   ABO/Rh     Status: None   Collection Time: 10/06/22  6:42 AM  Result Value Ref Range   ABO/RH(D)      B POS Performed at [facility], [address] [place], [place] [address]   CBC     Status: Abnormal   Collection Time: 10/07/22  5:19 AM  Result Value Ref Range   WBC 15.3 (H) 4.0 - 10.5 K/uL   RBC 3.27 (L) 3.87 - 5.11 MIL/uL   Hemoglobin 10.0 (L) 12.0 - 15.0 g/dL   HCT 25.9 (L) 56.3 - 87.5 %   MCV 93.6 80.0 - 100.0 fL   MCH 30.6 26.0 - 34.0 pg   MCHC 32.7 30.0 - 36.0 g/dL   RDW 64.3 32.9 - 51.8 %   Platelets 210 150 - 400 K/uL   nRBC 0.0 0.0 - 0.2 %    Comment: Performed at [facility], [address] [place], [place] [address]     Treatments: surgery: as above  Discharge Exam: Blood pressure 97/68, pulse 76, temperature 98.2 F (36.8 C), temperature source Oral, resp. rate 18, height 5\' 6"  (1.676 m), weight 88.5 kg, last menstrual period 09/09/2022, SpO2 92 %. General appearance: alert and cooperative Resp: clear to auscultation bilaterally Cardio: regular rate and rhythm, S1,  S2 normal, no murmur, click, rub or gallop GI: soft, non-tender; bowel sounds normal; no masses,  no organomegaly  Disposition:    Allergies as of 10/07/2022   No Known Allergies      Medication List     STOP taking these medications    phentermine 37.5 MG capsule       TAKE these medications    ALPRAZolam 0.5 MG tablet Commonly known as: XANAX Take 0.5 mg by mouth at bedtime as needed for anxiety.   bisoprolol-hydrochlorothiazide 10-6.25 MG tablet Commonly known as: ZIAC Take 1 tablet by mouth daily.   diphenhydrAMINE 25 mg capsule Commonly known as: BENADRYL Take 25 mg by mouth at bedtime as needed.   FLUoxetine 40 MG capsule Commonly known as: PROZAC Take 40 mg by mouth daily.   gabapentin 300 MG capsule Commonly known as: Neurontin Take 1 capsule (300 mg total) by mouth at bedtime.   ibuprofen 800 MG tablet Commonly known as: ADVIL Take 1 tablet (800 mg total) by mouth every 8 (eight) hours as needed. What changed:  medication strength how much to take when to take this   ondansetron 4 MG tablet Commonly known as: Zofran Take 1 tablet (4 mg total) by mouth daily as needed for nausea or vomiting.  oxyCODONE-acetaminophen 5-325 MG tablet Commonly known as: Percocet Take 1-2 tablets by mouth every 6 (six) hours as needed for up to 10 days for severe pain.   rosuvastatin 20 MG tablet Commonly known as: CRESTOR Take 10 mg by mouth daily.        Follow-up Information     Esperansa Sarabia, Ihor Austin, MD Follow up in 2 week(s).   Specialty: Obstetrics and Gynecology Why: post op Contact information: [address] [place] [address] [phone]                 Signed: Ihor Austin Hanh Kertesz 10/07/2022, 12:21 PM

## 2022-10-15 NOTE — Anesthesia Postprocedure Evaluation (Signed)
Anesthesia Post Note  Patient: Kristi English  Procedure(s) Performed: HYSTERECTOMY ABDOMINAL WITH BILATERAL SALPINGECTOMY (Bilateral: Abdomen) CYSTOSCOPY (Bladder)  Patient location during evaluation: PACU Anesthesia Type: General Level of consciousness: awake and alert Pain management: pain level controlled Vital Signs Assessment: post-procedure vital signs reviewed and stable Respiratory status: spontaneous breathing, nonlabored ventilation, respiratory function stable and patient connected to nasal cannula oxygen Cardiovascular status: blood pressure returned to baseline and stable Postop Assessment: no apparent nausea or vomiting Anesthetic complications: no   There were no known notable events for this encounter.   Last Vitals:  Vitals:   10/07/22 1239 10/07/22 1520  BP:  (!) 92/58  Pulse:  76  Resp: 17 18  Temp:  36.8 C  SpO2: 96% 97%    Last Pain:  Vitals:   10/07/22 1445  TempSrc:   PainSc: 6                  Lenard Simmer

## 2022-12-30 ENCOUNTER — Other Ambulatory Visit: Payer: Self-pay | Admitting: Internal Medicine

## 2022-12-30 DIAGNOSIS — Z1231 Encounter for screening mammogram for malignant neoplasm of breast: Secondary | ICD-10-CM

## 2023-02-01 ENCOUNTER — Ambulatory Visit
Admission: RE | Admit: 2023-02-01 | Discharge: 2023-02-01 | Disposition: A | Discharge status: Home / Self Care | Payer: 59 | Source: Ambulatory Visit | Attending: Internal Medicine | Admitting: Internal Medicine

## 2023-02-01 DIAGNOSIS — Z1231 Encounter for screening mammogram for malignant neoplasm of breast: Secondary | ICD-10-CM | POA: Insufficient documentation

## 2023-06-15 IMAGING — MG MM DIGITAL SCREENING BILAT W/ TOMO AND CAD
6 of 10 series · 6 of 30 positions shown · non-contrast
Comparison: None.

CLINICAL DATA: Screening.

EXAM:
DIGITAL SCREENING BILATERAL MAMMOGRAM WITH TOMOSYNTHESIS AND CAD
TECHNIQUE: Bilateral screening digital craniocaudal and mediolateral oblique
mammograms were obtained. Bilateral screening digital breast
tomosynthesis was performed. The images were evaluated with
computer-aided detection.

[L CC synth-2D]
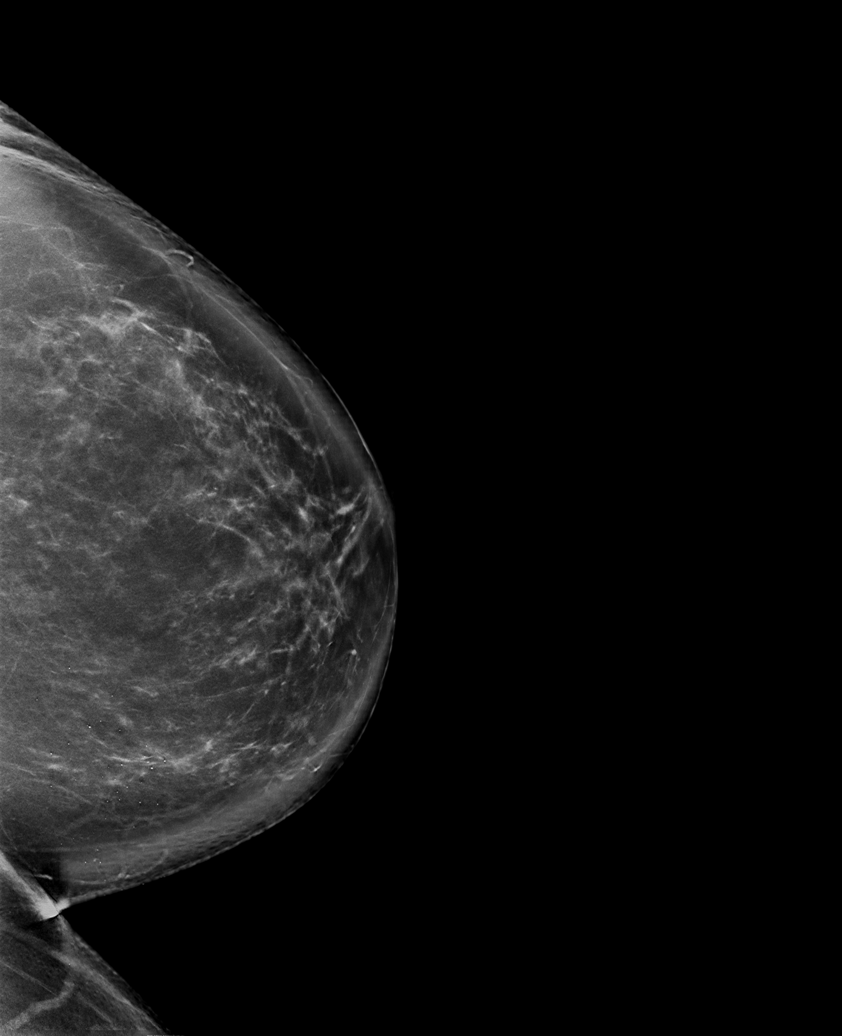

[R MLO synth-2D (1 of 2)]
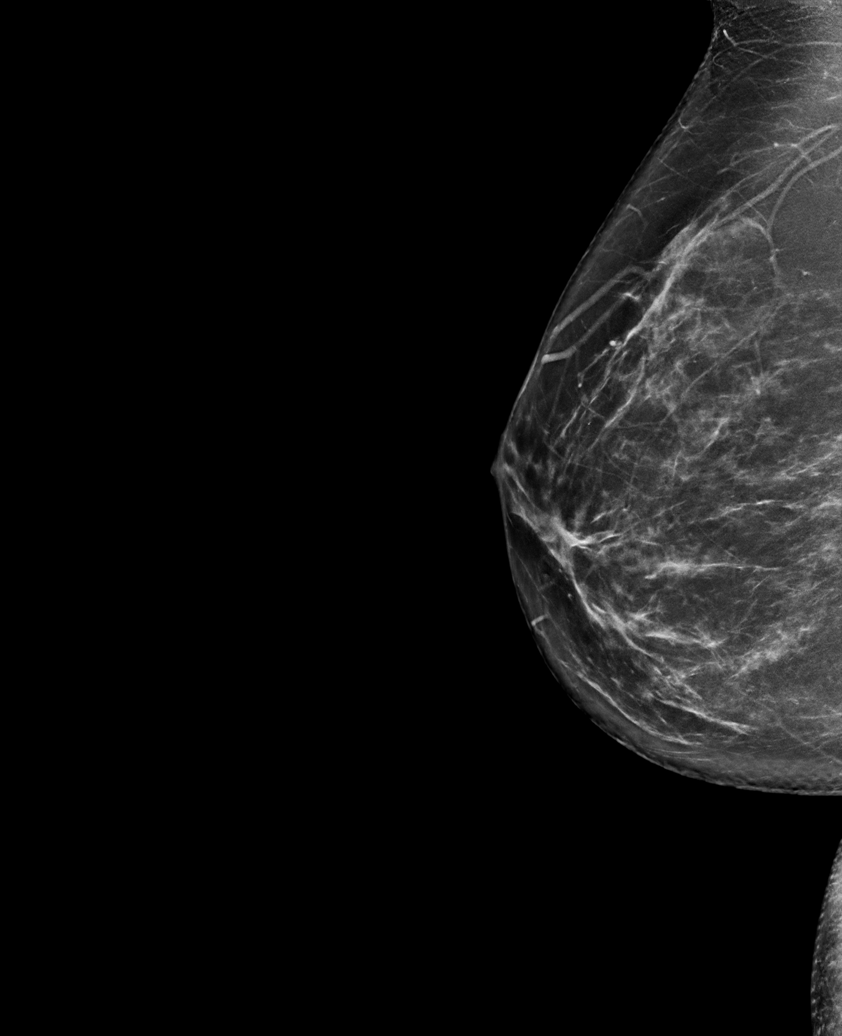

[R MLO synth-2D (2 of 2)]
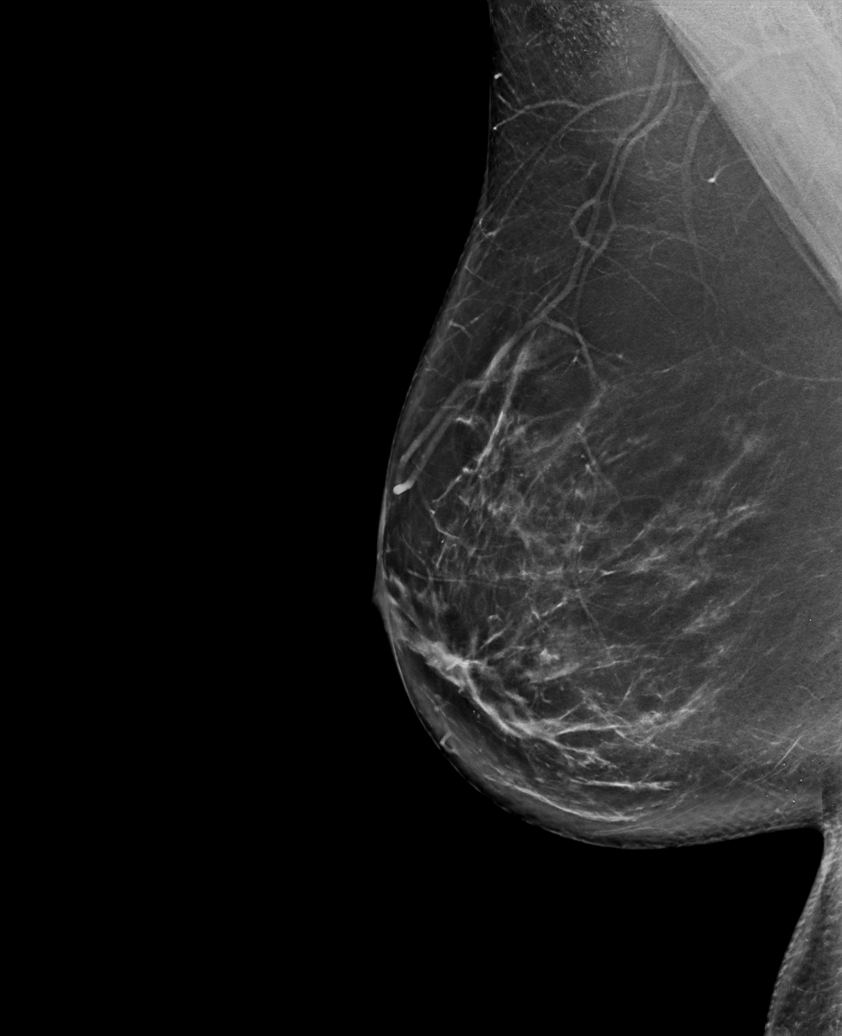

[R CC synth-2D]
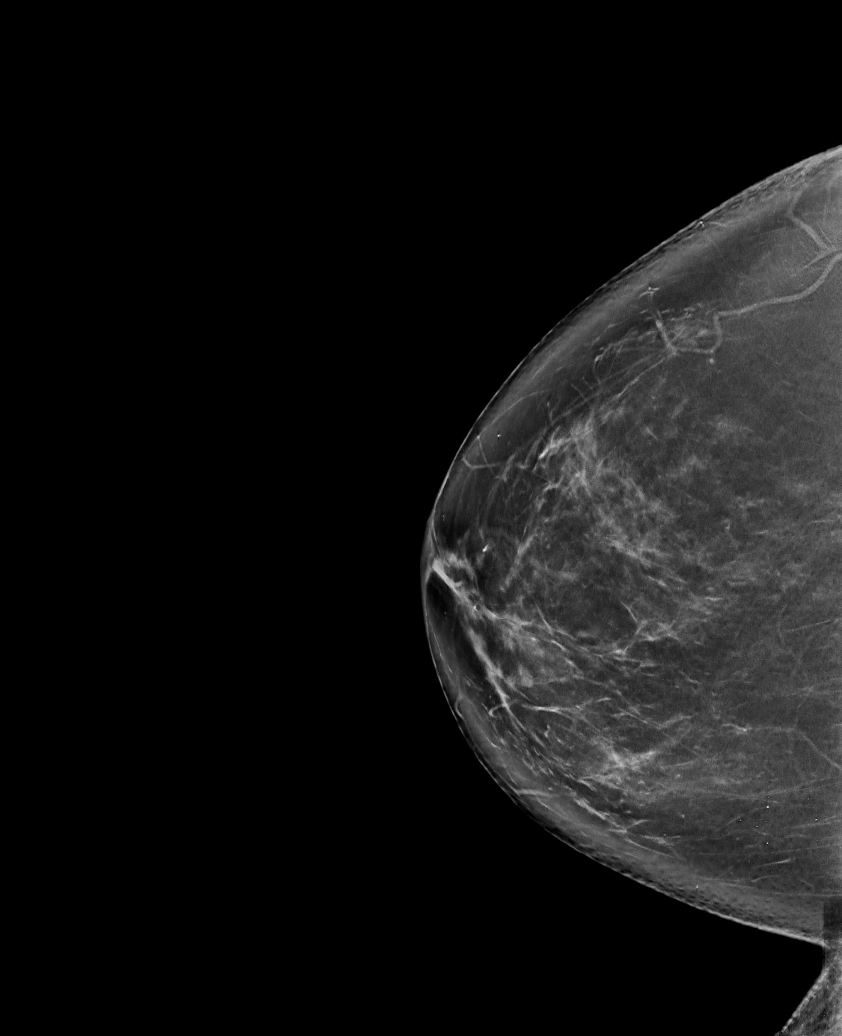

[L MLO synth-2D]
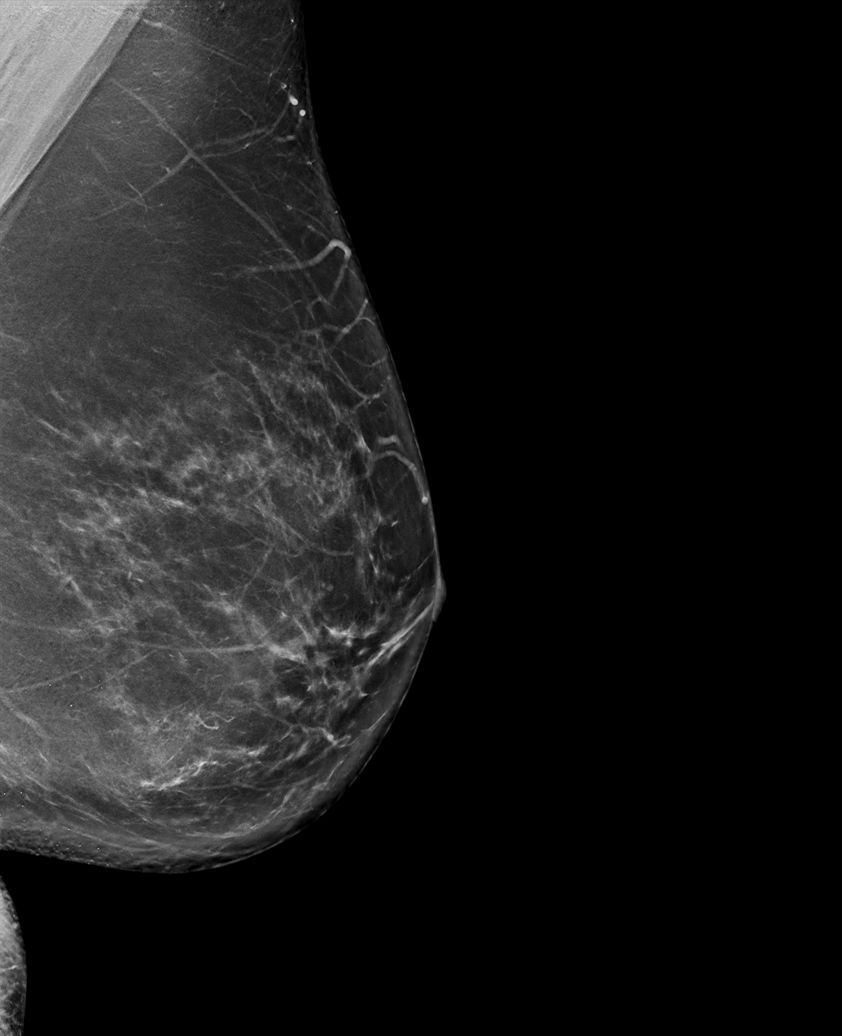

[R CC tomo · tomo slice 45/89.0]
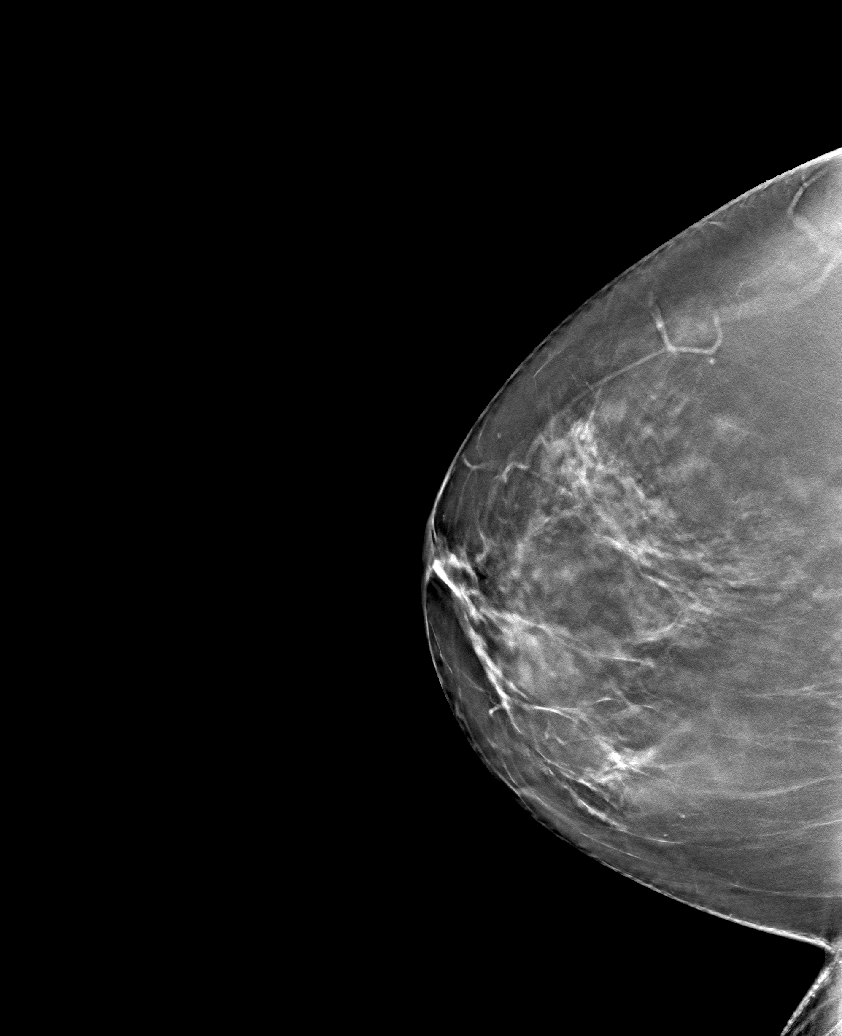

[6 of 30 positions shown; findings below may reference images not displayed]

ACR Breast Density Category b: There are scattered areas of
fibroglandular density.
FINDINGS: There are no findings suspicious for malignancy.
IMPRESSION: No mammographic evidence of malignancy. A result letter of this
screening mammogram will be mailed directly to the patient.

RECOMMENDATION:
Screening mammogram in one year. (Code:[id number]-X-X7B)

BI-RADS CATEGORY  1: Negative.
